# Patient Record
Sex: Female | Born: 2000 | Race: Black or African American | Hispanic: No | Marital: Single | Smoking: Never smoker
Health system: Southern US, Community
[De-identification: ages and names within clinical notes are randomized; demographics above are authoritative.]

## PROBLEM LIST (undated history)

## (undated) DIAGNOSIS — J302 Other seasonal allergic rhinitis: Secondary | ICD-10-CM

## (undated) DIAGNOSIS — R011 Cardiac murmur, unspecified: Secondary | ICD-10-CM

## (undated) DIAGNOSIS — I10 Essential (primary) hypertension: Secondary | ICD-10-CM

## (undated) HISTORY — PX: INDUCED ABORTION: SHX677

---
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
## 2019-02-13 ENCOUNTER — Other Ambulatory Visit: Payer: Self-pay | Admitting: Family Medicine

## 2019-02-13 DIAGNOSIS — Z309 Encounter for contraceptive management, unspecified: Secondary | ICD-10-CM

## 2019-02-25 DIAGNOSIS — Z113 Encounter for screening for infections with a predominantly sexual mode of transmission: Secondary | ICD-10-CM | POA: Diagnosis not present

## 2019-03-31 DIAGNOSIS — Z202 Contact with and (suspected) exposure to infections with a predominantly sexual mode of transmission: Secondary | ICD-10-CM | POA: Diagnosis not present

## 2019-03-31 DIAGNOSIS — Z113 Encounter for screening for infections with a predominantly sexual mode of transmission: Secondary | ICD-10-CM | POA: Diagnosis not present

## 2019-04-28 DIAGNOSIS — N890 Mild vaginal dysplasia: Secondary | ICD-10-CM | POA: Diagnosis not present

## 2019-04-28 DIAGNOSIS — N760 Acute vaginitis: Secondary | ICD-10-CM | POA: Diagnosis not present

## 2019-12-05 ENCOUNTER — Telehealth: Payer: Self-pay | Admitting: Family Medicine

## 2019-12-05 NOTE — Telephone Encounter (Signed)
  REFERRAL REQUEST Telephone Note 12/05/2019  What type of referral do you need? Dermotologist  Have you been seen at our office for this problem? No (Advise that they may need an appointment with their PCP before a referral can be done)  Is there a particular doctor or location that you prefer? [place]  Patient notified that referrals can take up to a week or longer to process. If they haven't heard anything within a week they should call back and speak with the referral department.   Please call pt.  She wasn't sure,if she needed to be seen first with her Insurance-Medicaid.

## 2019-12-06 NOTE — Telephone Encounter (Signed)
Aware, she will have to establish with another provider here and be seen for the skin problem to get a referral,  ( has dry patches).

## 2019-12-06 NOTE — Telephone Encounter (Signed)
Why does she need a referral to derm?

## 2020-02-03 ENCOUNTER — Emergency Department (INDEPENDENT_AMBULATORY_CARE_PROVIDER_SITE_OTHER): Payer: Medicaid Other

## 2020-02-03 ENCOUNTER — Other Ambulatory Visit (HOSPITAL_COMMUNITY)
Admission: RE | Admit: 2020-02-03 | Discharge: 2020-02-03 | Disposition: A | Discharge status: Home / Self Care | Payer: Medicaid Other | Source: Ambulatory Visit | Attending: Family Medicine | Admitting: Family Medicine

## 2020-02-03 ENCOUNTER — Emergency Department (INDEPENDENT_AMBULATORY_CARE_PROVIDER_SITE_OTHER)
Admission: RE | Admit: 2020-02-03 | Discharge: 2020-02-03 | Disposition: A | Discharge status: Home / Self Care | Payer: Medicaid Other | Source: Ambulatory Visit

## 2020-02-03 ENCOUNTER — Other Ambulatory Visit: Payer: Self-pay

## 2020-02-03 VITALS — BP 134/84 | HR 87 | Temp 99.3°F | Resp 17

## 2020-02-03 DIAGNOSIS — N858 Other specified noninflammatory disorders of uterus: Secondary | ICD-10-CM | POA: Diagnosis not present

## 2020-02-03 DIAGNOSIS — R102 Pelvic and perineal pain: Secondary | ICD-10-CM | POA: Diagnosis not present

## 2020-02-03 DIAGNOSIS — Z3201 Encounter for pregnancy test, result positive: Secondary | ICD-10-CM | POA: Diagnosis not present

## 2020-02-03 DIAGNOSIS — O039 Complete or unspecified spontaneous abortion without complication: Secondary | ICD-10-CM

## 2020-02-03 DIAGNOSIS — N898 Other specified noninflammatory disorders of vagina: Secondary | ICD-10-CM | POA: Diagnosis not present

## 2020-02-03 DIAGNOSIS — R109 Unspecified abdominal pain: Secondary | ICD-10-CM

## 2020-02-03 DIAGNOSIS — N939 Abnormal uterine and vaginal bleeding, unspecified: Secondary | ICD-10-CM | POA: Diagnosis not present

## 2020-02-03 DIAGNOSIS — Z113 Encounter for screening for infections with a predominantly sexual mode of transmission: Secondary | ICD-10-CM | POA: Insufficient documentation

## 2020-02-03 DIAGNOSIS — IMO0001 Reserved for inherently not codable concepts without codable children: Secondary | ICD-10-CM

## 2020-02-03 LAB — POCT URINE PREGNANCY: Preg Test, Ur: POSITIVE — AB

## 2020-02-03 MED ORDER — CEFTRIAXONE SODIUM 500 MG IJ SOLR
500.0000 mg | Freq: Once | INTRAMUSCULAR | Status: AC
  Administered 2020-02-03: 500 mg via INTRAMUSCULAR

## 2020-02-03 NOTE — ED Provider Notes (Signed)
[facility]    CSN: [id number] Arrival date & time: 02/03/20  1340      History   Chief Complaint Chief Complaint  Patient presents with  . Appointment  . Vaginal Bleeding    HPI CARREN BLAKLEY is a 19 y.o. female.   HPI BLAKLEIGH STRAW is a 19 y.o. female presenting to UC with c/o continued vaginal bleeding after having a medically induced abortion "with a pill" about 3 weeks ago, pt was about [redacted] weeks gestation via ultrasound.  Pt went back to the [facility] clinic yesterday, states the urine pregnancy was negative there, they did not perform any other tests. Pt states she is concerned for infection due to continued bleeding and a malodorous smell.  Denies fever, chills, n/v/d. Mild abdominal cramping.  Denies urinary symptoms. She does not have a a PCP or OB/GYN. Pt is not on birth control.     Past Medical History:  Diagnosis Date  . Hypertension    fluctuates- no medication  . Murmur, heart   . Seasonal allergies     Patient Active Problem List   Diagnosis Date Noted  . Eczema 08/02/2015  . Chest pain 09/14/2013  . Family history of cardiovascular disease 08/21/2011  . Murmur 08/21/2011  . Hypertension 08/21/2011    History reviewed. No pertinent surgical history.  OB History   No obstetric history on file.      Home Medications    Prior to Admission medications   Medication Sig Start Date End Date Taking? Authorizing Provider  metroNIDAZOLE (FLAGYL) 500 MG tablet Take 1 tablet (500 mg total) by mouth 2 (two) times daily. 10/19/18   Junie Spencer, FNP  norgestimate-ethinyl estradiol (SPRINTEC 28) 0.25-35 MG-MCG tablet Take 1 tablet by mouth daily. (Needs to be seen before next refill) 02/15/19   Rakes, Doralee Albino, FNP    Family History Family History  Problem Relation Age of Onset  . Asthma Brother     Social History Social History   Tobacco Use  . Smoking status: Never Smoker  . Smokeless tobacco: Never Used  Vaping Use  . Vaping Use:  Never used  Substance Use Topics  . Alcohol use: No  . Drug use: No     Allergies   Patient has no known allergies.   Review of Systems Review of Systems  Constitutional: Negative for chills and fever.  Gastrointestinal: Positive for abdominal pain (mininimal intermitent lower cramping). Negative for diarrhea, nausea and vomiting.  Genitourinary: Positive for vaginal bleeding and vaginal discharge. Negative for dysuria, flank pain, frequency and vaginal pain.     Physical Exam Triage Vital Signs ED Triage Vitals  Enc Vitals Group     BP 02/03/20 1428 134/84     Pulse Rate 02/03/20 1428 87     Resp 02/03/20 1428 17     Temp 02/03/20 1428 99.3 F (37.4 C)     Temp Source 02/03/20 1428 Oral     SpO2 02/03/20 1428 100 %     Weight --      Height --      Head Circumference --      Peak Flow --      Pain Score 02/03/20 1426 0     Pain Loc --      Pain Edu? --      Excl. in GC? --    No data found.  Updated Vital Signs BP 134/84 (BP Location: Left Arm)   Pulse 87   Temp  99.3 F (37.4 C) (Oral)   Resp 17   SpO2 100%   Breastfeeding No   Visual Acuity Right Eye Distance:   Left Eye Distance:   Bilateral Distance:    Right Eye Near:   Left Eye Near:    Bilateral Near:     Physical Exam Vitals and nursing note reviewed. Exam conducted with a chaperone present.  Constitutional:      Appearance: Normal appearance. She is well-developed.  HENT:     Head: Normocephalic and atraumatic.     Mouth/Throat:     Mouth: Mucous membranes are moist.  Cardiovascular:     Rate and Rhythm: Normal rate and regular rhythm.  Pulmonary:     Effort: Pulmonary effort is normal. No respiratory distress.     Breath sounds: Normal breath sounds.  Abdominal:     General: There is no distension.     Palpations: Abdomen is soft.     Tenderness: There is no abdominal tenderness. There is no right CVA tenderness or left CVA tenderness.  Genitourinary:    Labia:        Right: No  rash, tenderness, lesion or injury.        Left: No rash, tenderness, lesion or injury.      Vagina: Vaginal discharge and bleeding present.     Cervix: Discharge and cervical bleeding present.     Uterus: Normal.      Adnexa: Right adnexa normal and left adnexa normal.       Right: No mass or tenderness.         Left: No mass or tenderness.       Comments: Scant dark blood with moderate amount yellow-white discharge.  Musculoskeletal:        General: Normal range of motion.     Cervical back: Normal range of motion.  Skin:    General: Skin is warm and dry.  Neurological:     Mental Status: She is alert and oriented to person, place, and time.  Psychiatric:        Behavior: Behavior normal.      UC Treatments / Results  Labs (all labs ordered are listed, but only abnormal results are displayed) Labs Reviewed  BASIC METABOLIC PANEL - Abnormal; Notable for the following components:      Result Value   BUN 4 (*)    All other components within normal limits  HCG, TOTAL, QUANTITATIVE - Abnormal; Notable for the following components:   hCG, Beta Chain, Quant, S 282 (*)    All other components within normal limits  POCT URINE PREGNANCY - Abnormal; Notable for the following components:   Preg Test, Ur Positive (*)    All other components within normal limits  BETA HCG QUANT (REF LAB)  POCT CBC W AUTO DIFF ([facility])  CERVICOVAGINAL ANCILLARY ONLY    EKG   Radiology Narrative & Impression  CLINICAL DATA:  History of recent abortion 3 weeks ago with persistent bleeding and vaginal odor  EXAM: ULTRASOUND PELVIS TRANSVAGINAL  TECHNIQUE: Transvaginal ultrasound examination of the pelvis was performed including evaluation of the uterus, ovaries, adnexal regions, and pelvic cul-de-sac.  COMPARISON:  None.  FINDINGS: Uterus  Measurements: 7.7 x 3.7 x 6.0 cm. = volume: 87 mL. No fibroids or other mass visualized.  Endometrium.  Thickness: 11.8 mm.  Heterogeneous material remains in the endometrial canal. Some increased vascularity is noted within the endometrial canal suspicious for retained products of conception. Additionally some appears to  extend into the level of the myometrium which may have represented a mild placenta accreta. Correlate with quantitative beta HCG levels which are pending.  Right ovary  Measurements: 4.6 x 1.8 x 2.1 cm. = volume: 9 mL. Normal appearance/no adnexal mass.  Left ovary  Measurements: 3.4 x 1.9 x 1.9 cm. = volume: 6 mL. Normal appearance/no adnexal mass.  Other findings:  No abnormal free fluid  IMPRESSION: Heterogeneous material within the endometrial canal and extending into the myometrium with increased blood flow highly suspicious for retained products of conception. Correlate with beta HCG levels which are pending.   Electronically Signed   By: Alcide Clever M.D.   On: 02/03/2020 16:32      Procedures Procedures (including critical care time)  Medications Ordered in UC Medications  cefTRIAXone (ROCEPHIN) injection 500 mg (500 mg Intramuscular Given 02/03/20 1700)    Initial Impression / Assessment and Plan / UC Course  I have reviewed the triage vital signs and the nursing notes.  Pertinent labs & imaging results that were available during my care of the patient were reviewed by me and considered in my medical decision making (see chart for details).     Urine pregnancy in UC: positive Concern for secondary bacterial infection Pelvic US: discussed with pt Will tx empirically for GC/chlamydia Discussed importance of close f/u with OB/GYN  Resource guide provided Discussed symptoms that warrant emergent care in the ED. AVS given  Final Clinical Impressions(s) / UC Diagnoses   Final diagnoses:  Screen for STD (sexually transmitted disease)  Vaginal bleeding  Abdominal cramping  Positive urine pregnancy test  Vaginal discharge  Abortion     Discharge  Instructions      It is advised that you call on Monday to schedule an appointment with an OB/GYN for further evaluation and treatment of your symptoms.  You may need repeat blood work to make sure the numb is going down.  If you continue to have symptoms, you may need to have a D&C (dilation and curettage) to help remove remaining tissue to help prevent severe infection and to help add in healing.    In the meantime, please take the prescribed antibiotics to help treat any current infection. You will be notified of today's labs in 2-3 days and if any additional treatment is indicated.   Call 911 or have someone drive you to the hospital if symptoms significantly worsening.     ED Prescriptions    None     PDMP not reviewed this encounter.   Lurene Shadow, [place] 02/06/20 [phone]

## 2020-02-03 NOTE — ED Triage Notes (Signed)
Patient reports she had an abortion 3 weeks ago, still bleeding at this time, and has now developed vaginal odor. Denies UTI symptoms and vaginal discharge.   Has a history of bacterial vaginosis, states similar symptoms.

## 2020-02-03 NOTE — ED Notes (Signed)
Pt to ultrasound

## 2020-02-03 NOTE — Discharge Instructions (Signed)
  It is advised that you call on Monday to schedule an appointment with an OB/GYN for further evaluation and treatment of your symptoms.  You may need repeat blood work to make sure the numb is going down.  If you continue to have symptoms, you may need to have a D&C (dilation and curettage) to help remove remaining tissue to help prevent severe infection and to help add in healing.    In the meantime, please take the prescribed antibiotics to help treat any current infection. You will be notified of today's labs in 2-3 days and if any additional treatment is indicated.   Call 911 or have someone drive you to the hospital if symptoms significantly worsening.

## 2020-02-04 LAB — BASIC METABOLIC PANEL
BUN/Creatinine Ratio: 6 (calc) (ref 6–22)
BUN: 4 mg/dL — ABNORMAL LOW (ref 7–20)
CO2: 26 mmol/L (ref 20–32)
Calcium: 9.7 mg/dL (ref 8.9–10.4)
Chloride: 105 mmol/L (ref 98–110)
Creat: 0.69 mg/dL (ref 0.50–1.00)
Glucose, Bld: 88 mg/dL (ref 65–99)
Potassium: 4 mmol/L (ref 3.8–5.1)
Sodium: 141 mmol/L (ref 135–146)

## 2020-02-04 LAB — HCG, TOTAL, QUANTITATIVE: hCG, Beta Chain, Quant, S: 282 m[IU]/mL — ABNORMAL HIGH

## 2020-02-06 ENCOUNTER — Telehealth (HOSPITAL_COMMUNITY): Payer: Self-pay | Admitting: Emergency Medicine

## 2020-02-06 ENCOUNTER — Telehealth: Payer: Self-pay | Admitting: *Deleted

## 2020-02-06 LAB — CERVICOVAGINAL ANCILLARY ONLY
Bacterial Vaginitis (gardnerella): POSITIVE — AB
Candida Glabrata: NEGATIVE
Candida Vaginitis: NEGATIVE
Chlamydia: NEGATIVE
Comment: NEGATIVE
Comment: NEGATIVE
Comment: NEGATIVE
Comment: NEGATIVE
Comment: NEGATIVE
Comment: NORMAL
Neisseria Gonorrhea: NEGATIVE
Trichomonas: NEGATIVE

## 2020-02-06 LAB — POCT CBC W AUTO DIFF (K'VILLE URGENT CARE)

## 2020-02-06 MED ORDER — METRONIDAZOLE 0.75 % VA GEL: 1.0000 | Freq: Every day | VAGINAL | 0 refills | Status: AC

## 2020-02-06 NOTE — Telephone Encounter (Signed)
Clinical staff suggested that patient is evaluated at [facility] due to patient needing to be seen by a MD as soon as possible. Patient states that she is worried about a possible infection and POC from a pill based abortion 3 weeks ago. [facility] can not get patient in to be seen until Friday, clinical staff stated, that is to long to wait. Patient agreed with treatment plan.

## 2020-02-07 ENCOUNTER — Other Ambulatory Visit: Payer: Self-pay

## 2020-02-07 ENCOUNTER — Encounter (HOSPITAL_COMMUNITY): Payer: Self-pay

## 2020-02-07 ENCOUNTER — Emergency Department (HOSPITAL_COMMUNITY)
Admission: EM | Admit: 2020-02-07 | Discharge: 2020-02-08 | Disposition: A | Discharge status: Home / Self Care | Payer: Medicaid Other | Attending: Emergency Medicine | Admitting: Emergency Medicine

## 2020-02-07 DIAGNOSIS — Z79899 Other long term (current) drug therapy: Secondary | ICD-10-CM | POA: Insufficient documentation

## 2020-02-07 DIAGNOSIS — N898 Other specified noninflammatory disorders of vagina: Secondary | ICD-10-CM | POA: Diagnosis present

## 2020-02-07 DIAGNOSIS — I10 Essential (primary) hypertension: Secondary | ICD-10-CM | POA: Diagnosis not present

## 2020-02-07 DIAGNOSIS — O034 Incomplete spontaneous abortion without complication: Secondary | ICD-10-CM | POA: Diagnosis not present

## 2020-02-07 LAB — BASIC METABOLIC PANEL
Anion gap: 8 (ref 5–15)
BUN: 9 mg/dL (ref 6–20)
CO2: 25 mmol/L (ref 22–32)
Calcium: 8.8 mg/dL — ABNORMAL LOW (ref 8.9–10.3)
Chloride: 105 mmol/L (ref 98–111)
Creatinine, Ser: 0.74 mg/dL (ref 0.44–1.00)
GFR calc Af Amer: >60 mL/min (ref >60)
GFR calc non Af Amer: >60 mL/min (ref >60)
Glucose, Bld: 101 mg/dL — ABNORMAL HIGH (ref 70–99)
Potassium: 4.3 mmol/L (ref 3.5–5.1)
Sodium: 138 mmol/L (ref 135–145)

## 2020-02-07 LAB — CBC WITH DIFFERENTIAL/PLATELET
Abs Immature Granulocytes: 0.01 10*3/uL (ref 0.00–0.07)
Basophils Absolute: 0 10*3/uL (ref 0.0–0.1)
Basophils Relative: 1 %
Eosinophils Absolute: 0.3 10*3/uL (ref 0.0–0.5)
Eosinophils Relative: 5 %
HCT: 34.7 % — ABNORMAL LOW (ref 36.0–46.0)
Hemoglobin: 10.6 g/dL — ABNORMAL LOW (ref 12.0–15.0)
Immature Granulocytes: 0 %
Lymphocytes Relative: 35 %
Lymphs Abs: 2 10*3/uL (ref 0.7–4.0)
MCH: 23.1 pg — ABNORMAL LOW (ref 26.0–34.0)
MCHC: 30.5 g/dL (ref 30.0–36.0)
MCV: 75.6 fL — ABNORMAL LOW (ref 80.0–100.0)
Monocytes Absolute: 0.5 10*3/uL (ref 0.1–1.0)
Monocytes Relative: 9 %
Neutro Abs: 2.9 10*3/uL (ref 1.7–7.7)
Neutrophils Relative %: 50 %
Platelets: 226 10*3/uL (ref 150–400)
RBC: 4.59 MIL/uL (ref 3.87–5.11)
RDW: 15 % (ref 11.5–15.5)
WBC: 5.7 10*3/uL (ref 4.0–10.5)
nRBC: 0 % (ref 0.0–0.2)

## 2020-02-07 LAB — WET PREP, GENITAL
Sperm: NONE SEEN
Trich, Wet Prep: NONE SEEN
Yeast Wet Prep HPF POC: NONE SEEN

## 2020-02-07 LAB — HCG, QUANTITATIVE, PREGNANCY: hCG, Beta Chain, Quant, S: 158 m[IU]/mL — ABNORMAL HIGH (ref <5)

## 2020-02-07 MED ORDER — MISOPROSTOL 200 MCG PO TABS
800.0000 ug | ORAL_TABLET | Freq: Once | ORAL | Status: AC
  Administered 2020-02-07: 800 ug via ORAL
  Filled 2020-02-07: qty 4

## 2020-02-07 NOTE — ED Triage Notes (Signed)
Patient  states she had an abortion 3 weeks ago and was seen at the UC 4 days ago for vaginal odor and abdominal pain. Patient states she was told to follow up with an OB/GYN for possible D and C. Patient states the one she called was not accepting new patients.

## 2020-02-07 NOTE — ED Provider Notes (Signed)
[facility]-EMERGENCY DEPT Provider Note   CSN: [id number] Arrival date & time: 02/07/20  1407     History Chief Complaint  Patient presents with  . abdominal discomfort  . Follow-up    Laura Navarro is a 19 y.o. female.  HPI Patient presents for follow-up after vaginal discharge.  At the beginning of this month patient had a medically induced abortion.  States she took the pills on the last day of last month and the first day of this month.  States she did pass some tissue at that time.  5 days ago followed up with the clinic, [facility].  States she had a negative urine pregnancy test at that time.  States that 4 days ago went to urgent care because of some abdominal pain and vaginal order.  States she was concerned that she had bacterial vaginosis.  Had an ultrasound done that showed potentially retained products of conception.  Had pelvic exam done showed some bleeding with a comment of scant blood with moderate amount of yellow-white discharge.  Was reportedly treated for gonorrhea chlamydia.  Had not been instructed to follow-up with OB/GYN but states she was unable to get with an appointment before 2 weeks from now.  Reviewing notes in the computer yesterday apparently she was called and told she needed to going to [facility] immediately for further evaluation.  Presents at [employer] today. Patient states she has had no further vaginal discharge or bleeding since the ultrasound 4 days ago except for some mild vaginal discharge of bacterial vaginosis.    Past Medical History:  Diagnosis Date  . Hypertension    fluctuates- no medication  . Murmur, heart   . Seasonal allergies     Patient Active Problem List   Diagnosis Date Noted  . Eczema 08/02/2015  . Chest pain 09/14/2013  . Family history of cardiovascular disease 08/21/2011  . Murmur 08/21/2011  . Hypertension 08/21/2011    Past Surgical History:  Procedure Laterality Date  . INDUCED  ABORTION       OB History   No obstetric history on file.     Family History  Problem Relation Age of Onset  . Asthma Brother     Social History   Tobacco Use  . Smoking status: Never Smoker  . Smokeless tobacco: Never Used  Vaping Use  . Vaping Use: Never used  Substance Use Topics  . Alcohol use: No  . Drug use: No    Home Medications Prior to Admission medications   Medication Sig Start Date End Date Taking? Authorizing Provider  betamethasone dipropionate (DIPROLENE) 0.05 % ointment Apply 1 application topically daily. 01/27/20  Yes [provider]  cetirizine (ZYRTEC) 10 MG tablet Take 10 mg by mouth daily as needed for allergies.   Yes [provider]  metroNIDAZOLE (FLAGYL) 500 MG tablet Take 1 tablet (500 mg total) by mouth 2 (two) times daily. Patient not taking: Reported on 02/07/2020 10/19/18   Jannifer Rodney A, FNP  metroNIDAZOLE (METROGEL VAGINAL) 0.75 % vaginal gel Place 1 Applicatorful vaginally at bedtime for 5 days. 02/06/20 02/11/20  Merrilee Jansky, MD  norgestimate-ethinyl estradiol (SPRINTEC 28) 0.25-35 MG-MCG tablet Take 1 tablet by mouth daily. (Needs to be seen before next refill) Patient not taking: Reported on 02/07/2020 02/15/19   Sonny Masters, FNP    Allergies    Patient has no known allergies.  Review of Systems   Review of Systems  Constitutional: Negative for appetite  change and fever.  HENT: Negative for congestion.   Respiratory: Negative for shortness of breath.   Gastrointestinal: Positive for abdominal pain.  Genitourinary: Negative for vaginal discharge.  Musculoskeletal: Negative for back pain.  Skin: Negative for rash.  Neurological: Negative for weakness.  Psychiatric/Behavioral: Negative for confusion.    Physical Exam Updated Vital Signs BP 134/86   Pulse 79   Temp 99.1 F (37.3 C) (Oral)   Resp 20   Ht 5\' 5"  (1.651 m)   Wt 70.3 kg   SpO2 100%   BMI 25.79 kg/m   Physical Exam Vitals and nursing  note reviewed.  HENT:     Head: Normocephalic.  Eyes:     Pupils: Pupils are equal, round, and reactive to light.  Cardiovascular:     Rate and Rhythm: Regular rhythm.  Pulmonary:     Breath sounds: No wheezing or rhonchi.  Abdominal:     Tenderness: There is no abdominal tenderness.     Comments: Mild suprapubic tenderness without rebound or guarding.  Genitourinary:    Comments: Minimal blood in the vagina.  No blood from cervix.  No purulent drainage.  No cervical motion tenderness. Musculoskeletal:        General: No tenderness.  Neurological:     Mental Status: She is alert and oriented to person, place, and time.     ED Results / Procedures / Treatments   Labs (all labs ordered are listed, but only abnormal results are displayed) Labs Reviewed  WET PREP, GENITAL - Abnormal; Notable for the following components:      Result Value   Clue Cells Wet Prep HPF POC PRESENT (*)    WBC, Wet Prep HPF POC FEW (*)    All other components within normal limits  CBC WITH DIFFERENTIAL/PLATELET - Abnormal; Notable for the following components:   Hemoglobin 10.6 (*)    HCT 34.7 (*)    MCV 75.6 (*)    MCH 23.1 (*)    All other components within normal limits  BASIC METABOLIC PANEL - Abnormal; Notable for the following components:   Glucose, Bld 101 (*)    Calcium 8.8 (*)    All other components within normal limits  HCG, QUANTITATIVE, PREGNANCY - Abnormal; Notable for the following components:   hCG, Beta Chain, Quant, S 158 (*)    All other components within normal limits    EKG None  Radiology No results found.  Procedures Procedures (including critical care time)  Medications Ordered in ED Medications  misoprostol (CYTOTEC) tablet 800 mcg (800 mcg Oral Given 02/07/20 2347)    ED Course  I have reviewed the triage vital signs and the nursing notes.  Pertinent labs & imaging results that were available during my care of the patient were reviewed by me and considered  in my medical decision making (see chart for details).    MDM Rules/Calculators/A&P                          Patient with follow-up after a medical abortion.  Seen at another clinic 4 days ago and had ultrasound that showed possible retained products possible placenta accreta.  Has been unable to arrange outpatient follow-up expeditiously and was told to come in for further evaluation.  Pelvic exam benign.  CBC and hCG pending.  hCG had been elevated 4 days ago to 282. Discussed with Dr. 2348 on for the [facility] clinic.  Will discuss with him again  after the labs has resulted to determine if needs more expeditious follow-up or outpatient follow-up.  Lab work reassuring.  Beta going down.  Discussed with Dr. Gust Rung again.  Outpatient follow-up will be arranged.  Will give Cytotec. Final Clinical Impression(s) / ED Diagnoses Final diagnoses:  Retained products of conception following abortion    Rx / DC Orders ED Discharge Orders    None       Benjiman Core, MD 02/08/20 1506

## 2020-02-07 NOTE — Discharge Instructions (Addendum)
You should be contacted from one of the women's centers about follow-up. Return for worsening abdominal pain or fevers. Go to [facility] at [facility] if needed.

## 2020-02-09 ENCOUNTER — Telehealth: Payer: Self-pay | Admitting: *Deleted

## 2020-02-09 ENCOUNTER — Encounter: Payer: Self-pay | Admitting: Obstetrics & Gynecology

## 2020-02-09 ENCOUNTER — Other Ambulatory Visit: Payer: Self-pay

## 2020-02-09 ENCOUNTER — Ambulatory Visit: Payer: Medicaid Other | Admitting: Obstetrics & Gynecology

## 2020-02-09 VITALS — BP 136/85 | HR 88 | Ht 66.0 in | Wt 155.0 lb

## 2020-02-09 DIAGNOSIS — O034 Incomplete spontaneous abortion without complication: Secondary | ICD-10-CM | POA: Diagnosis not present

## 2020-02-09 MED ORDER — MISOPROSTOL 200 MCG PO TABS: ORAL_TABLET | ORAL | 0 refills | Status: DC

## 2020-02-09 NOTE — Telephone Encounter (Signed)
Contacted patient to complete Transition of Care Assessment: .Transition Care Management Follow-up Telephone Call  Date of discharge and from where: 02/08/20, [facility]  How have you been since you were released from the hospital? "I'm fine"  Any questions or concerns? No  Items Reviewed:  Did the pt receive and understand the discharge instructions provided? Yes   Medications obtained and verified? Yes   Any new allergies since your discharge? Yes   Dietary orders reviewed? NO   Do you have support at home? Yes   Functional Questionnaire: (I = Independent and D = Dependent) ADLs: I  Bathing/Dressing- I  Meal Prep- I  Eating- I  Maintaining continence- I  Transferring/Ambulation- I  Managing Meds- I  Follow up appointments reviewed:   PCP Hospital f/u appt confirmed? No Patient to contact the office of Gilford Silvius, FNP on 02/09/20 to schedule follow up appt.  Specialist Hospital f/u appt confirmed? Yes  Scheduled to see Dr Erin Fulling on 02/09/20 @ 0915, and Dr Leroy Libman on 02/23/20 at 0945.  Are transportation arrangements needed? No   If their condition worsens, is the pt aware to call PCP or go to the Emergency Dept.? YES  Was the patient provided with contact information for the PCP's office or ED? YES   Was to pt encouraged to call back with questions or concerns? YES  Burnard Bunting, RN, BSN, CCRN [facility] [phone]

## 2020-02-09 NOTE — Progress Notes (Signed)
History:  19 y.o. G1P0010 here today for eval of bleeding after EAB. Pt is s/p elective termination of 8-9 week IUP on 01/08/2020. She was seen in the ED for continued bleeding and discharge and was told that she had POC. When she went, she thought that she just had BV.  She is here for eval. She reports that since that time she has passed some small clots and now she is only having light spotting. She denies pain.    The following portions of the patient's history were reviewed and updated as appropriate: allergies, current medications, past family history, past medical history, past social history, past surgical history and problem list.  Review of Systems:  Pertinent items are noted in HPI.    Objective:  Physical Exam Blood pressure 136/85, pulse 88, height 5\' 6"  (1.676 m), weight 155 lb (70.3 kg). BP 136/85   Pulse 88   Ht 5\' 6"  (1.676 m)   Wt 155 lb (70.3 kg)   BMI 25.02 kg/m   CONSTITUTIONAL: Well-developed, well-nourished female in no acute distress.  HENT:  Normocephalic, atraumatic EYES: Conjunctivae and EOM are normal. No scleral icterus.  NECK: Normal range of motion SKIN: Skin is warm and dry. No rash noted. Not diaphoretic.No pallor. NEUROLGIC: Alert and oriented to person, place, and time. Normal coordination.  Abd: Soft, nontender and nondistended Pelvic: ofc TVUS 02/09/2020 Endometrial stripe .7cm. There is some fluid near the top of the lining. No clots or POC noted.  Labs and Imaging PELVIS TRANSVAGINAL NON-OB (TV ONLY)  Result Date: 02/03/2020 CLINICAL DATA:  History of recent abortion 3 weeks ago with persistent bleeding and vaginal odor EXAM: ULTRASOUND PELVIS TRANSVAGINAL TECHNIQUE: Transvaginal ultrasound examination of the pelvis was performed including evaluation of the uterus, ovaries, adnexal regions, and pelvic cul-de-sac. COMPARISON:  None. FINDINGS: Uterus Measurements: 7.7 x 3.7 x 6.0 cm. = volume: 87 mL. No fibroids or other mass visualized. Endometrium.  Thickness: 11.8 mm. Heterogeneous material remains in the endometrial canal. Some increased vascularity is noted within the endometrial canal suspicious for retained products of conception. Additionally some appears to extend into the level of the myometrium which may have represented a mild placenta accreta. Correlate with quantitative beta HCG levels which are pending. Right ovary Measurements: 4.6 x 1.8 x 2.1 cm. = volume: 9 mL. Normal appearance/no adnexal mass. Left ovary Measurements: 3.4 x 1.9 x 1.9 cm. = volume: 6 mL. Normal appearance/no adnexal mass. Other findings:  No abnormal free fluid IMPRESSION: Heterogeneous material within the endometrial canal and extending into the myometrium with increased blood flow highly suspicious for retained products of conception. Correlate with beta HCG levels which are pending. Electronically Signed   By: [place] M.D.   On: 02/03/2020 16:32      Assessment & Plan:   Incomplete AB- since pt is still spotting will give cyto tect x1.   Contraception counseling- pt is not sexually active at present. I have counseled her to make sure that she follows up prior ot becoming sexually active. I have reccommended condom use at all times if sexually active.   Total face-to-face time with patient was 25 min.  Greater than 50% was spent in counseling and coordination of care with the patient.   Milca Sytsma L. Harraway-Smith, M.D., Alcide Clever

## 2020-02-23 ENCOUNTER — Encounter: Payer: Self-pay | Admitting: Obstetrics and Gynecology

## 2020-02-23 ENCOUNTER — Other Ambulatory Visit: Payer: Self-pay

## 2020-02-23 ENCOUNTER — Ambulatory Visit: Payer: Medicaid Other | Admitting: Obstetrics and Gynecology

## 2020-02-23 VITALS — BP 128/73 | HR 85 | Resp 16 | Ht 66.0 in | Wt 155.0 lb

## 2020-02-23 DIAGNOSIS — O039 Complete or unspecified spontaneous abortion without complication: Secondary | ICD-10-CM | POA: Diagnosis not present

## 2020-02-23 DIAGNOSIS — Z8742 Personal history of other diseases of the female genital tract: Secondary | ICD-10-CM

## 2020-02-23 NOTE — Progress Notes (Signed)
   GYNECOLOGY OFFICE FOLLOW UP NOTE  History:  19 y.o. G1P0010 here today for follow up for TAB. Concerned for ongoing bleeding and was prescribed cytotec. She reports heavy bleeding for about 4 days after taking the cytotec with large blood clots. No bleeding since, she is otherwise well.  Past Medical History:  Diagnosis Date  . Hypertension    fluctuates- no medication  . Murmur, heart   . Seasonal allergies     Past Surgical History:  Procedure Laterality Date  . INDUCED ABORTION       Current Outpatient Medications:  .  betamethasone valerate ointment (VALISONE) 0.1 %, Apply topically., Disp: , Rfl:   The following portions of the patient's history were reviewed and updated as appropriate: allergies, current medications, past family history, past medical history, past social history, past surgical history and problem list.   Review of Systems:  Pertinent items noted in HPI and remainder of comprehensive ROS otherwise negative.   Objective:  Physical Exam BP 128/73   Pulse 85   Resp 16   Ht 5\' 6"  (1.676 m)   Wt 155 lb (70.3 kg)   BMI 25.02 kg/m  CONSTITUTIONAL: Well-developed, well-nourished female in no acute distress.  HENT:  Normocephalic, atraumatic. External right and left ear normal. Oropharynx is clear and moist EYES: Conjunctivae and EOM are normal. Pupils are equal, round, and reactive to light. No scleral icterus.  NECK: Normal range of motion, supple, no masses SKIN: Skin is warm and dry. No rash noted. Not diaphoretic. No erythema. No pallor. NEUROLOGIC: Alert and oriented to person, place, and time. Normal reflexes, muscle tone coordination. No cranial nerve deficit noted. PSYCHIATRIC: Normal mood and affect. Normal behavior. Normal judgment and thought content. CARDIOVASCULAR: Normal heart rate noted RESPIRATORY: Effort normal, no problems with respiration noted ABDOMEN: Soft, no distention noted.   PELVIC: deferred. MUSCULOSKELETAL: Normal range of  motion. No edema noted.    Assessment & Plan:   1. History of termination of pregnancy Likely patient passed any remaining tissue given large clots that were expressed HCG today to ensure it is decreasing Return 3 months for discussion of contraception, pt declines to discuss today - B-HCG Quant   Routine preventative health maintenance measures emphasized. Please refer to After Visit Summary for other counseling recommendations.   Return in about 3 months (around 05/24/2020) for annual.  Total face-to-face time with patient: 15 minutes. Over 50% of encounter was spent on counseling and coordination of care.  05/26/2020, M.D. [facility]ending [facility] [employer])

## 2020-02-24 LAB — HCG, QUANTITATIVE, PREGNANCY: HCG, Total, QN: 40 m[IU]/mL

## 2020-04-10 DIAGNOSIS — N760 Acute vaginitis: Secondary | ICD-10-CM | POA: Diagnosis not present

## 2020-04-10 DIAGNOSIS — Z113 Encounter for screening for infections with a predominantly sexual mode of transmission: Secondary | ICD-10-CM | POA: Diagnosis not present

## 2020-04-10 DIAGNOSIS — Z114 Encounter for screening for human immunodeficiency virus [HIV]: Secondary | ICD-10-CM | POA: Diagnosis not present

## 2020-04-10 DIAGNOSIS — L209 Atopic dermatitis, unspecified: Secondary | ICD-10-CM | POA: Diagnosis not present

## 2020-08-31 DIAGNOSIS — Z113 Encounter for screening for infections with a predominantly sexual mode of transmission: Secondary | ICD-10-CM | POA: Diagnosis not present

## 2021-01-07 DIAGNOSIS — Z113 Encounter for screening for infections with a predominantly sexual mode of transmission: Secondary | ICD-10-CM | POA: Diagnosis not present

## 2021-01-07 DIAGNOSIS — N760 Acute vaginitis: Secondary | ICD-10-CM | POA: Diagnosis not present

## 2022-05-10 IMAGING — US US TRANSVAGINAL NON-OB
1 series · 13 of 25 positions shown · non-contrast
Comparison: None.

CLINICAL DATA: History of recent abortion 3 weeks ago with
persistent bleeding and vaginal odor

EXAM:
ULTRASOUND PELVIS TRANSVAGINAL
TECHNIQUE: Transvaginal ultrasound examination of the pelvis was performed
including evaluation of the uterus, ovaries, adnexal regions, and
pelvic cul-de-sac.

[Series 1: us transvaginal non-ob · 0.11mm/px · 13 of 72 slices shown]
[im 1/72]
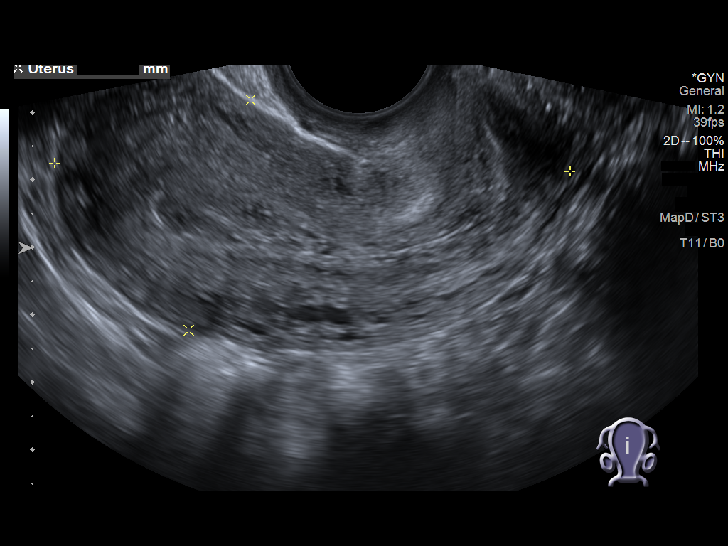
[im 6/72]
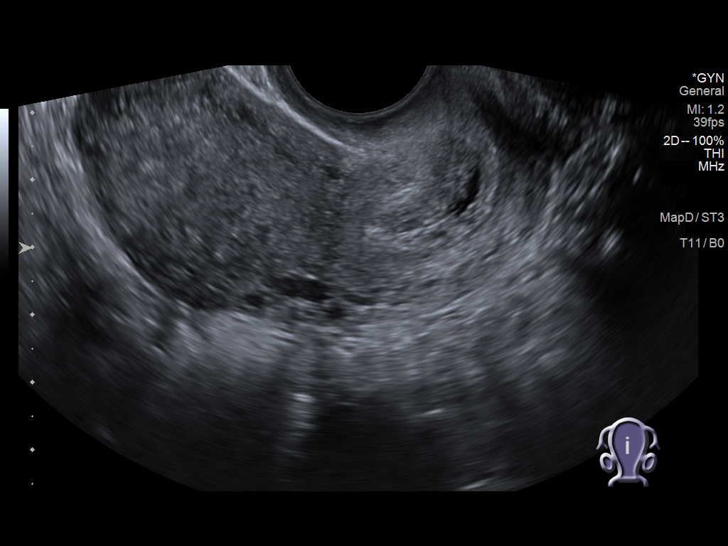
[im 12/72]
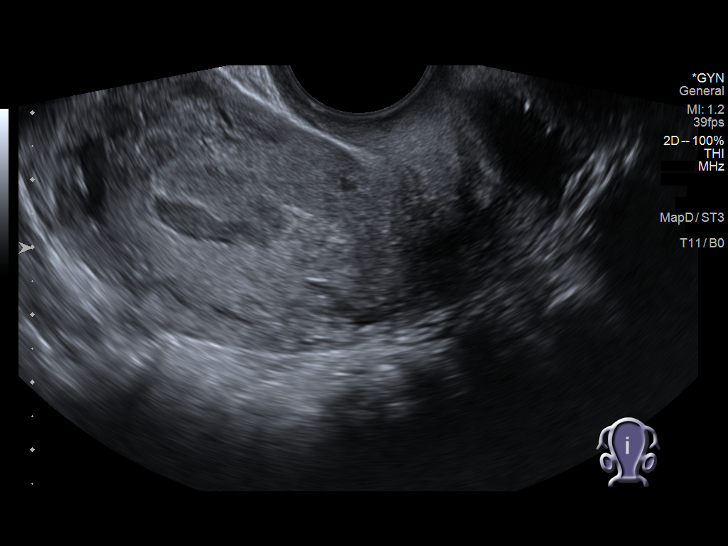
[im 18/72]
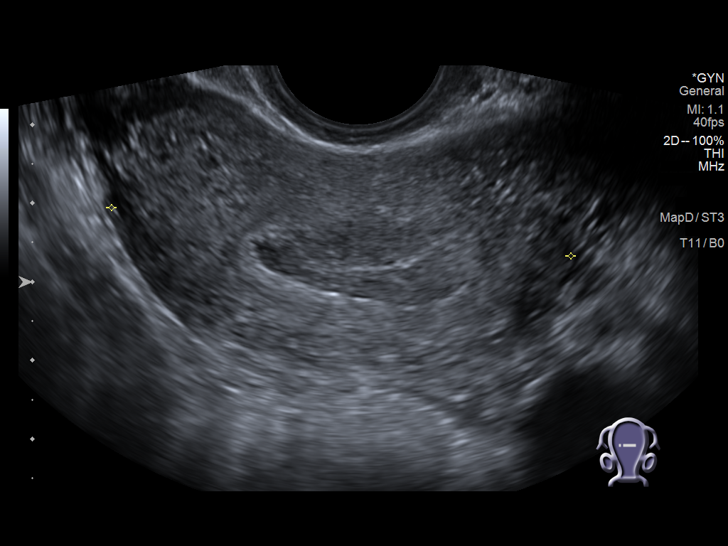
[im 24/72]
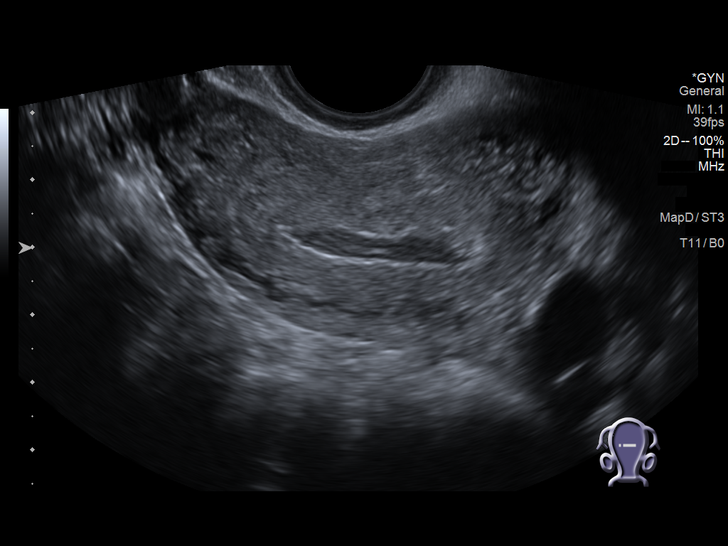
[im 30/72]
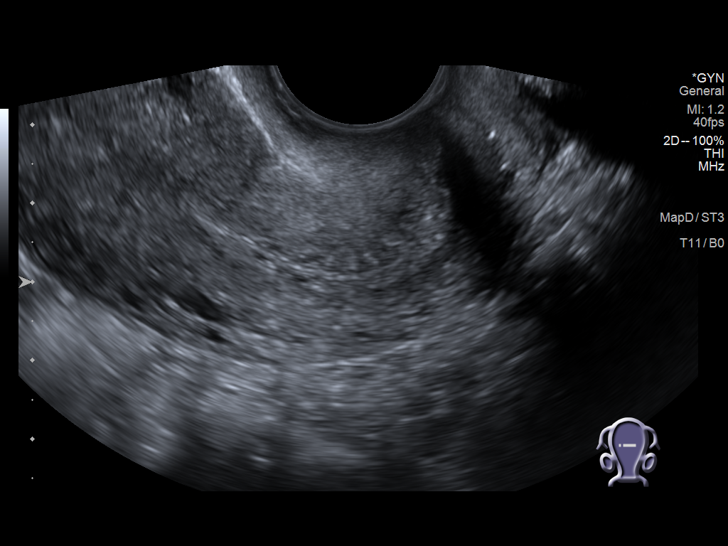
[im 36/72]
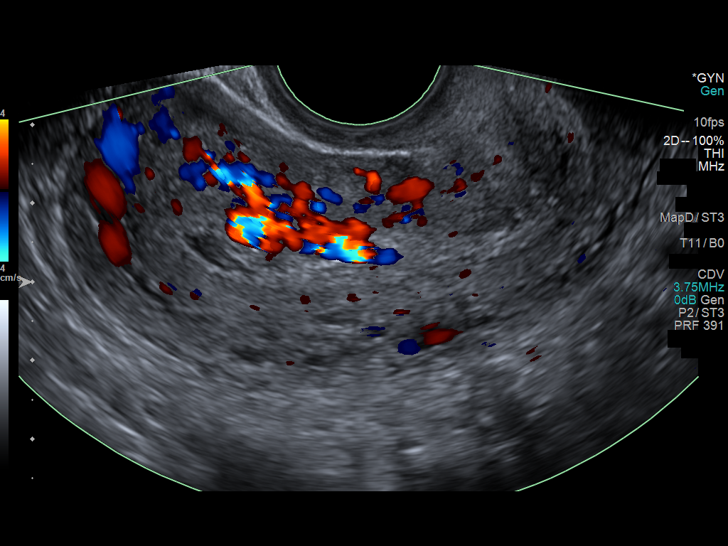
[im 42/72]
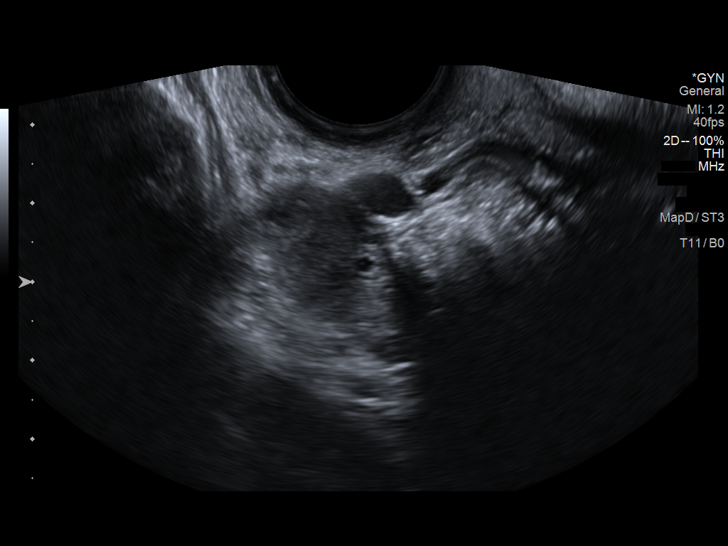
[im 48/72]
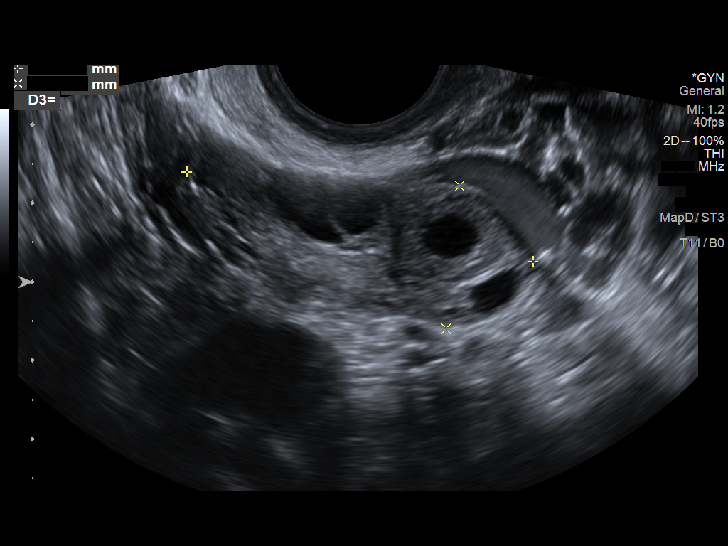
[im 54/72]
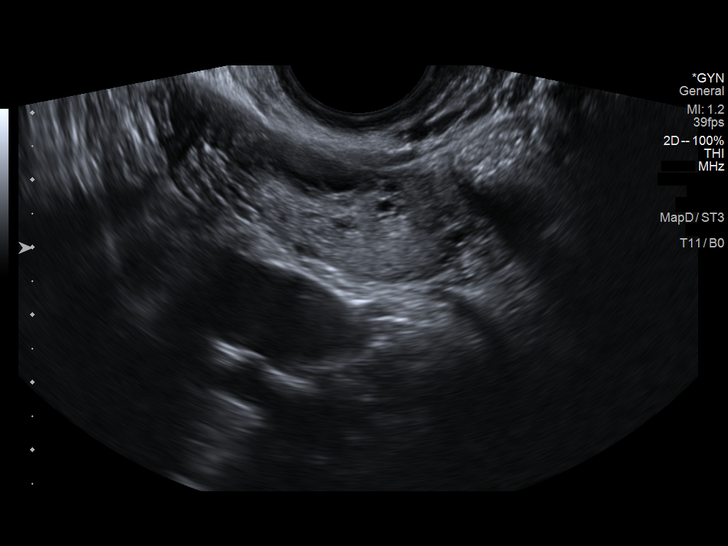
[im 60/72]
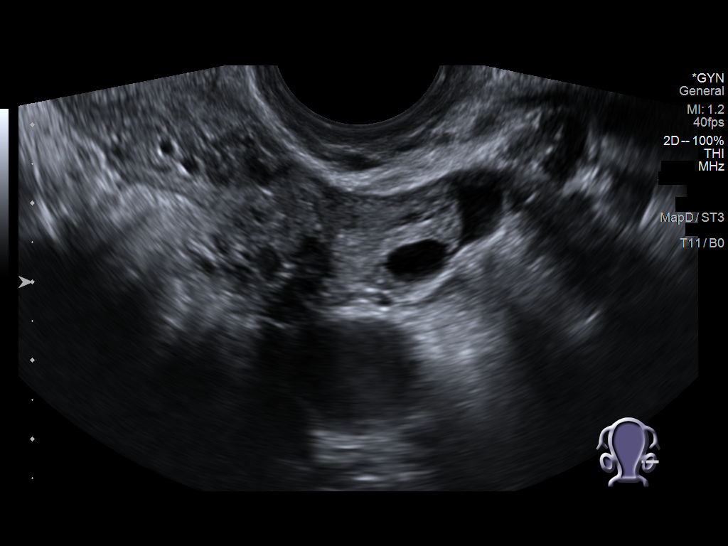
[im 66/72]
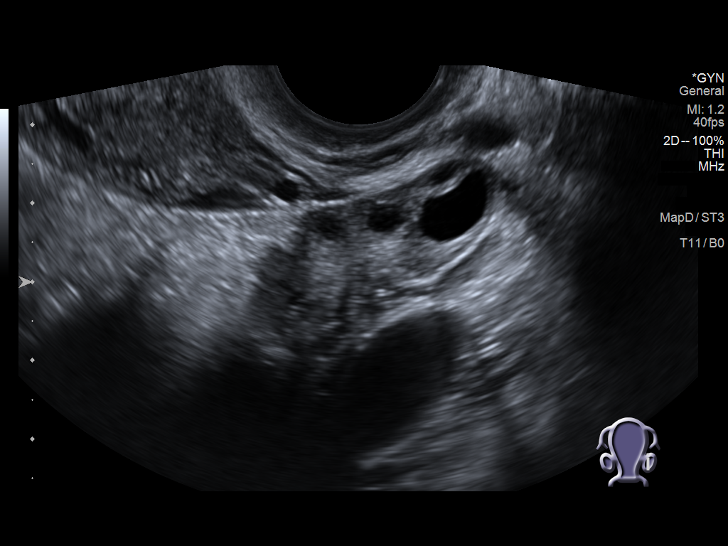
[im 72/72]
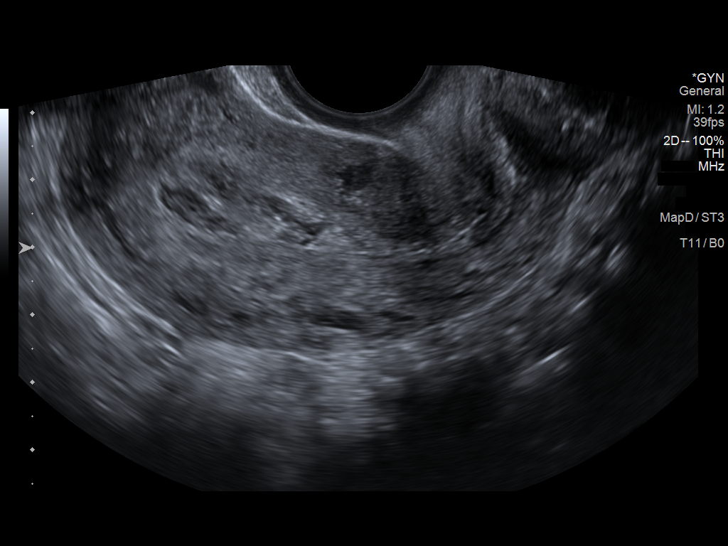

[13 of 25 positions shown; findings below may reference images not displayed]

FINDINGS: Uterus

Measurements: 7.7 x 3.7 x 6.0 cm. = volume: 87 mL. No fibroids or
other mass visualized.

Endometrium.

Thickness: 11.8 mm. Heterogeneous material remains in the
endometrial canal. Some increased vascularity is noted within the
endometrial canal suspicious for retained products of conception.
Additionally some appears to extend into the level of the myometrium
which may have represented a mild placenta accreta. Correlate with
quantitative beta HCG levels which are pending.

Right ovary

Measurements: 4.6 x 1.8 x 2.1 cm. = volume: 9 mL. Normal
appearance/no adnexal mass.

Left ovary

Measurements: 3.4 x 1.9 x 1.9 cm. = volume: 6 mL. Normal
appearance/no adnexal mass.

Other findings:  No abnormal free fluid
IMPRESSION: Heterogeneous material within the endometrial canal and extending
into the myometrium with increased blood flow highly suspicious for
retained products of conception. Correlate with beta HCG levels
which are pending.
